# Patient Record
Sex: Female | Born: 1960 | Race: White | Hispanic: No | Marital: Married | Smoking: Never smoker
Health system: Southern US, Community
[De-identification: ages and names within clinical notes are randomized; demographics above are authoritative.]

## PROBLEM LIST (undated history)

## (undated) DIAGNOSIS — F419 Anxiety disorder, unspecified: Secondary | ICD-10-CM

## (undated) DIAGNOSIS — M76822 Posterior tibial tendinitis, left leg: Secondary | ICD-10-CM

## (undated) HISTORY — PX: ENDOMETRIAL ABLATION: SHX621

## (undated) HISTORY — PX: CHOLECYSTECTOMY: SHX55

## (undated) HISTORY — PX: KNEE ARTHROSCOPY: SUR90

---

## 1998-08-10 ENCOUNTER — Other Ambulatory Visit: Admission: RE | Admit: 1998-08-10 | Discharge: 1998-08-10 | Payer: Self-pay | Admitting: *Deleted

## 1998-10-02 ENCOUNTER — Other Ambulatory Visit: Admission: RE | Admit: 1998-10-02 | Discharge: 1998-10-02 | Payer: Self-pay | Admitting: *Deleted

## 1998-12-25 ENCOUNTER — Other Ambulatory Visit: Admission: RE | Admit: 1998-12-25 | Discharge: 1998-12-25 | Payer: Self-pay | Admitting: *Deleted

## 2000-01-20 ENCOUNTER — Other Ambulatory Visit: Admission: RE | Admit: 2000-01-20 | Discharge: 2000-01-20 | Payer: Self-pay | Admitting: *Deleted

## 2002-02-06 ENCOUNTER — Other Ambulatory Visit: Admission: RE | Admit: 2002-02-06 | Discharge: 2002-02-06 | Payer: Self-pay | Admitting: *Deleted

## 2003-02-12 ENCOUNTER — Other Ambulatory Visit: Admission: RE | Admit: 2003-02-12 | Discharge: 2003-02-12 | Payer: Self-pay | Admitting: *Deleted

## 2004-06-08 ENCOUNTER — Other Ambulatory Visit: Admission: RE | Admit: 2004-06-08 | Discharge: 2004-06-08 | Payer: Self-pay | Admitting: Obstetrics and Gynecology

## 2015-09-01 ENCOUNTER — Other Ambulatory Visit: Payer: Self-pay | Admitting: Obstetrics and Gynecology

## 2015-09-01 DIAGNOSIS — R928 Other abnormal and inconclusive findings on diagnostic imaging of breast: Secondary | ICD-10-CM

## 2015-09-11 ENCOUNTER — Ambulatory Visit
Admission: RE | Admit: 2015-09-11 | Discharge: 2015-09-11 | Disposition: A | Discharge status: Home / Self Care | Payer: BLUE CROSS/BLUE SHIELD | Source: Ambulatory Visit | Attending: Obstetrics and Gynecology | Admitting: Obstetrics and Gynecology

## 2015-09-11 DIAGNOSIS — R928 Other abnormal and inconclusive findings on diagnostic imaging of breast: Secondary | ICD-10-CM

## 2015-11-10 ENCOUNTER — Other Ambulatory Visit: Payer: Self-pay | Admitting: Obstetrics and Gynecology

## 2015-11-10 DIAGNOSIS — N63 Unspecified lump in unspecified breast: Secondary | ICD-10-CM

## 2015-11-12 ENCOUNTER — Other Ambulatory Visit: Payer: Self-pay | Admitting: Obstetrics and Gynecology

## 2015-11-12 DIAGNOSIS — N63 Unspecified lump in unspecified breast: Secondary | ICD-10-CM

## 2015-11-18 ENCOUNTER — Ambulatory Visit
Admission: RE | Admit: 2015-11-18 | Discharge: 2015-11-18 | Disposition: A | Discharge status: Home / Self Care | Payer: BLUE CROSS/BLUE SHIELD | Source: Ambulatory Visit | Attending: Obstetrics and Gynecology | Admitting: Obstetrics and Gynecology

## 2015-11-18 ENCOUNTER — Other Ambulatory Visit: Payer: Self-pay | Admitting: Obstetrics and Gynecology

## 2015-11-18 DIAGNOSIS — N63 Unspecified lump in unspecified breast: Secondary | ICD-10-CM

## 2015-11-18 DIAGNOSIS — N6002 Solitary cyst of left breast: Secondary | ICD-10-CM

## 2016-06-21 ENCOUNTER — Other Ambulatory Visit: Payer: Self-pay | Admitting: Obstetrics and Gynecology

## 2016-06-21 DIAGNOSIS — Z1231 Encounter for screening mammogram for malignant neoplasm of breast: Secondary | ICD-10-CM

## 2016-09-02 ENCOUNTER — Ambulatory Visit: Payer: BLUE CROSS/BLUE SHIELD

## 2016-09-02 ENCOUNTER — Ambulatory Visit
Admission: RE | Admit: 2016-09-02 | Discharge: 2016-09-02 | Disposition: A | Discharge status: Home / Self Care | Payer: BLUE CROSS/BLUE SHIELD | Source: Ambulatory Visit | Attending: Obstetrics and Gynecology | Admitting: Obstetrics and Gynecology

## 2016-09-02 DIAGNOSIS — Z1231 Encounter for screening mammogram for malignant neoplasm of breast: Secondary | ICD-10-CM

## 2017-08-02 ENCOUNTER — Other Ambulatory Visit: Payer: Self-pay | Admitting: Obstetrics and Gynecology

## 2017-08-02 DIAGNOSIS — Z1231 Encounter for screening mammogram for malignant neoplasm of breast: Secondary | ICD-10-CM

## 2017-10-09 ENCOUNTER — Ambulatory Visit
Admission: RE | Admit: 2017-10-09 | Discharge: 2017-10-09 | Disposition: A | Discharge status: Home / Self Care | Payer: BC Managed Care – PPO | Source: Ambulatory Visit | Attending: Obstetrics and Gynecology | Admitting: Obstetrics and Gynecology

## 2017-10-09 DIAGNOSIS — Z1231 Encounter for screening mammogram for malignant neoplasm of breast: Secondary | ICD-10-CM

## 2018-08-31 ENCOUNTER — Other Ambulatory Visit: Payer: Self-pay | Admitting: Obstetrics and Gynecology

## 2018-08-31 DIAGNOSIS — Z1231 Encounter for screening mammogram for malignant neoplasm of breast: Secondary | ICD-10-CM

## 2018-10-23 ENCOUNTER — Ambulatory Visit: Payer: BC Managed Care – PPO

## 2018-12-03 ENCOUNTER — Ambulatory Visit
Admission: RE | Admit: 2018-12-03 | Discharge: 2018-12-03 | Disposition: A | Discharge status: Home / Self Care | Payer: BC Managed Care – PPO | Source: Ambulatory Visit | Attending: Obstetrics and Gynecology | Admitting: Obstetrics and Gynecology

## 2018-12-03 ENCOUNTER — Other Ambulatory Visit: Payer: Self-pay

## 2018-12-03 DIAGNOSIS — Z1231 Encounter for screening mammogram for malignant neoplasm of breast: Secondary | ICD-10-CM

## 2018-12-04 ENCOUNTER — Other Ambulatory Visit: Payer: Self-pay | Admitting: Obstetrics and Gynecology

## 2018-12-04 DIAGNOSIS — R928 Other abnormal and inconclusive findings on diagnostic imaging of breast: Secondary | ICD-10-CM

## 2018-12-06 ENCOUNTER — Ambulatory Visit
Admission: RE | Admit: 2018-12-06 | Discharge: 2018-12-06 | Disposition: A | Discharge status: Home / Self Care | Payer: BC Managed Care – PPO | Source: Ambulatory Visit | Attending: Obstetrics and Gynecology | Admitting: Obstetrics and Gynecology

## 2018-12-06 ENCOUNTER — Other Ambulatory Visit: Payer: Self-pay

## 2018-12-06 ENCOUNTER — Other Ambulatory Visit: Payer: Self-pay | Admitting: Obstetrics and Gynecology

## 2018-12-06 DIAGNOSIS — R928 Other abnormal and inconclusive findings on diagnostic imaging of breast: Secondary | ICD-10-CM

## 2018-12-06 DIAGNOSIS — N6002 Solitary cyst of left breast: Secondary | ICD-10-CM

## 2018-12-10 ENCOUNTER — Ambulatory Visit
Admission: RE | Admit: 2018-12-10 | Discharge: 2018-12-10 | Disposition: A | Discharge status: Home / Self Care | Payer: BC Managed Care – PPO | Source: Ambulatory Visit | Attending: Obstetrics and Gynecology | Admitting: Obstetrics and Gynecology

## 2018-12-10 ENCOUNTER — Other Ambulatory Visit: Payer: Self-pay

## 2018-12-10 DIAGNOSIS — N6002 Solitary cyst of left breast: Secondary | ICD-10-CM

## 2019-09-05 ENCOUNTER — Other Ambulatory Visit: Payer: Self-pay | Admitting: Obstetrics and Gynecology

## 2019-09-05 DIAGNOSIS — Z1231 Encounter for screening mammogram for malignant neoplasm of breast: Secondary | ICD-10-CM

## 2019-12-06 ENCOUNTER — Inpatient Hospital Stay: Admission: RE | Admit: 2019-12-06 | Payer: BC Managed Care – PPO | Source: Ambulatory Visit

## 2019-12-06 ENCOUNTER — Other Ambulatory Visit: Payer: Self-pay

## 2019-12-06 ENCOUNTER — Ambulatory Visit
Admission: RE | Admit: 2019-12-06 | Discharge: 2019-12-06 | Disposition: A | Discharge status: Home / Self Care | Payer: BC Managed Care – PPO | Source: Ambulatory Visit | Attending: Obstetrics and Gynecology | Admitting: Obstetrics and Gynecology

## 2019-12-06 DIAGNOSIS — Z1231 Encounter for screening mammogram for malignant neoplasm of breast: Secondary | ICD-10-CM

## 2019-12-09 ENCOUNTER — Ambulatory Visit: Payer: BC Managed Care – PPO

## 2019-12-11 ENCOUNTER — Other Ambulatory Visit: Payer: Self-pay | Admitting: Obstetrics and Gynecology

## 2019-12-11 DIAGNOSIS — R928 Other abnormal and inconclusive findings on diagnostic imaging of breast: Secondary | ICD-10-CM

## 2019-12-16 ENCOUNTER — Other Ambulatory Visit: Payer: Self-pay

## 2019-12-16 ENCOUNTER — Other Ambulatory Visit: Payer: Self-pay | Admitting: Obstetrics and Gynecology

## 2019-12-16 ENCOUNTER — Ambulatory Visit
Admission: RE | Admit: 2019-12-16 | Discharge: 2019-12-16 | Disposition: A | Discharge status: Home / Self Care | Payer: BC Managed Care – PPO | Source: Ambulatory Visit | Attending: Obstetrics and Gynecology | Admitting: Obstetrics and Gynecology

## 2019-12-16 DIAGNOSIS — R928 Other abnormal and inconclusive findings on diagnostic imaging of breast: Secondary | ICD-10-CM

## 2020-04-03 ENCOUNTER — Other Ambulatory Visit (HOSPITAL_COMMUNITY): Payer: Self-pay | Admitting: Orthopedic Surgery

## 2020-04-07 ENCOUNTER — Encounter (HOSPITAL_BASED_OUTPATIENT_CLINIC_OR_DEPARTMENT_OTHER): Payer: Self-pay | Admitting: Orthopedic Surgery

## 2020-04-07 ENCOUNTER — Other Ambulatory Visit: Payer: Self-pay

## 2020-04-10 ENCOUNTER — Other Ambulatory Visit (HOSPITAL_COMMUNITY)
Admission: RE | Admit: 2020-04-10 | Discharge: 2020-04-10 | Disposition: A | Discharge status: Home / Self Care | Payer: BC Managed Care – PPO | Source: Ambulatory Visit | Attending: Orthopedic Surgery | Admitting: Orthopedic Surgery

## 2020-04-10 DIAGNOSIS — Z01812 Encounter for preprocedural laboratory examination: Secondary | ICD-10-CM | POA: Insufficient documentation

## 2020-04-10 DIAGNOSIS — Z20822 Contact with and (suspected) exposure to covid-19: Secondary | ICD-10-CM | POA: Diagnosis not present

## 2020-04-10 LAB — SARS CORONAVIRUS 2 (TAT 6-24 HRS): SARS Coronavirus 2: NEGATIVE

## 2020-04-14 ENCOUNTER — Ambulatory Visit (HOSPITAL_BASED_OUTPATIENT_CLINIC_OR_DEPARTMENT_OTHER): Payer: BC Managed Care – PPO | Admitting: Anesthesiology

## 2020-04-14 ENCOUNTER — Encounter (HOSPITAL_BASED_OUTPATIENT_CLINIC_OR_DEPARTMENT_OTHER)
Admission: RE | Disposition: A | Discharge status: Home / Self Care | Payer: Self-pay | Source: Ambulatory Visit | Attending: Orthopedic Surgery

## 2020-04-14 ENCOUNTER — Other Ambulatory Visit: Payer: Self-pay

## 2020-04-14 ENCOUNTER — Encounter (HOSPITAL_BASED_OUTPATIENT_CLINIC_OR_DEPARTMENT_OTHER): Payer: Self-pay | Admitting: Orthopedic Surgery

## 2020-04-14 ENCOUNTER — Ambulatory Visit (HOSPITAL_BASED_OUTPATIENT_CLINIC_OR_DEPARTMENT_OTHER)
Admission: RE | Admit: 2020-04-14 | Discharge: 2020-04-14 | Disposition: A | Discharge status: Home / Self Care | Payer: BC Managed Care – PPO | Source: Ambulatory Visit | Attending: Orthopedic Surgery | Admitting: Orthopedic Surgery

## 2020-04-14 DIAGNOSIS — Z79899 Other long term (current) drug therapy: Secondary | ICD-10-CM | POA: Insufficient documentation

## 2020-04-14 DIAGNOSIS — M25572 Pain in left ankle and joints of left foot: Secondary | ICD-10-CM | POA: Diagnosis not present

## 2020-04-14 DIAGNOSIS — Q6689 Other  specified congenital deformities of feet: Secondary | ICD-10-CM | POA: Diagnosis not present

## 2020-04-14 DIAGNOSIS — Z885 Allergy status to narcotic agent status: Secondary | ICD-10-CM | POA: Insufficient documentation

## 2020-04-14 DIAGNOSIS — M6702 Short Achilles tendon (acquired), left ankle: Secondary | ICD-10-CM | POA: Insufficient documentation

## 2020-04-14 DIAGNOSIS — M76822 Posterior tibial tendinitis, left leg: Secondary | ICD-10-CM | POA: Diagnosis present

## 2020-04-14 DIAGNOSIS — Z880 Allergy status to penicillin: Secondary | ICD-10-CM | POA: Insufficient documentation

## 2020-04-14 HISTORY — PX: POSTERIOR TIBIAL TENDON REPAIR: SHX6039

## 2020-04-14 HISTORY — DX: Posterior tibial tendinitis, left leg: M76.822

## 2020-04-14 HISTORY — DX: Anxiety disorder, unspecified: F41.9

## 2020-04-14 HISTORY — PX: CALCANEAL OSTEOTOMY: SHX1281

## 2020-04-14 SURGERY — OSTEOTOMY, CALCANEUS
Anesthesia: Regional | Site: Leg Lower | Laterality: Left

## 2020-04-14 MED ORDER — MIDAZOLAM HCL 2 MG/2ML IJ SOLN
1.0000 mg | Freq: Once | INTRAMUSCULAR | Status: AC
  Administered 2020-04-14: 12:00:00 2 mg via INTRAVENOUS

## 2020-04-14 MED ORDER — DEXAMETHASONE SODIUM PHOSPHATE 10 MG/ML IJ SOLN
INTRAMUSCULAR | Status: DC | PRN
  Administered 2020-04-14: 5 mg

## 2020-04-14 MED ORDER — VANCOMYCIN HCL 500 MG IV SOLR
INTRAVENOUS | Status: AC
  Filled 2020-04-14: qty 500

## 2020-04-14 MED ORDER — SODIUM CHLORIDE 0.9 % IV SOLN: INTRAVENOUS | Status: DC

## 2020-04-14 MED ORDER — MIDAZOLAM HCL 2 MG/2ML IJ SOLN
INTRAMUSCULAR | Status: AC
  Filled 2020-04-14: qty 2

## 2020-04-14 MED ORDER — CEFAZOLIN SODIUM-DEXTROSE 2-4 GM/100ML-% IV SOLN
INTRAVENOUS | Status: AC
  Filled 2020-04-14: qty 100

## 2020-04-14 MED ORDER — BUPIVACAINE LIPOSOME 1.3 % IJ SUSP
INTRAMUSCULAR | Status: DC | PRN
  Administered 2020-04-14: 10 mL via PERINEURAL

## 2020-04-14 MED ORDER — BUPIVACAINE HCL (PF) 0.5 % IJ SOLN
INTRAMUSCULAR | Status: AC
  Filled 2020-04-14: qty 30

## 2020-04-14 MED ORDER — 0.9 % SODIUM CHLORIDE (POUR BTL) OPTIME
TOPICAL | Status: DC | PRN
  Administered 2020-04-14: 15:00:00 200 mL

## 2020-04-14 MED ORDER — DEXAMETHASONE SODIUM PHOSPHATE 10 MG/ML IJ SOLN
INTRAMUSCULAR | Status: DC | PRN
  Administered 2020-04-14: 14:00:00 5 mg via INTRAVENOUS

## 2020-04-14 MED ORDER — ONDANSETRON HCL 4 MG PO TABS: 4.0000 mg | ORAL_TABLET | Freq: Every day | ORAL | 0 refills | Status: DC | PRN

## 2020-04-14 MED ORDER — ACETAMINOPHEN 500 MG PO TABS
1000.0000 mg | ORAL_TABLET | Freq: Once | ORAL | Status: AC
  Administered 2020-04-14: 11:00:00 1000 mg via ORAL

## 2020-04-14 MED ORDER — BUPIVACAINE HCL (PF) 0.5 % IJ SOLN
INTRAMUSCULAR | Status: DC | PRN
  Administered 2020-04-14: 20 mL via PERINEURAL

## 2020-04-14 MED ORDER — OXYCODONE HCL 5 MG PO TABS: 5.0000 mg | ORAL_TABLET | ORAL | 0 refills | Status: AC | PRN

## 2020-04-14 MED ORDER — ONDANSETRON HCL 4 MG/2ML IJ SOLN
INTRAMUSCULAR | Status: DC | PRN
  Administered 2020-04-14: 4 mg via INTRAVENOUS

## 2020-04-14 MED ORDER — LACTATED RINGERS IV SOLN: INTRAVENOUS | Status: DC

## 2020-04-14 MED ORDER — FENTANYL CITRATE (PF) 100 MCG/2ML IJ SOLN
INTRAMUSCULAR | Status: AC
  Filled 2020-04-14: qty 2

## 2020-04-14 MED ORDER — PROPOFOL 10 MG/ML IV BOLUS
INTRAVENOUS | Status: AC
  Filled 2020-04-14: qty 20

## 2020-04-14 MED ORDER — LIDOCAINE 2% (20 MG/ML) 5 ML SYRINGE
INTRAMUSCULAR | Status: AC
  Filled 2020-04-14: qty 5

## 2020-04-14 MED ORDER — CEFAZOLIN SODIUM-DEXTROSE 2-4 GM/100ML-% IV SOLN
2.0000 g | INTRAVENOUS | Status: AC
  Administered 2020-04-14: 14:00:00 2 g via INTRAVENOUS

## 2020-04-14 MED ORDER — LIDOCAINE HCL (CARDIAC) PF 100 MG/5ML IV SOSY
PREFILLED_SYRINGE | INTRAVENOUS | Status: DC | PRN
  Administered 2020-04-14: 20 mg via INTRATRACHEAL

## 2020-04-14 MED ORDER — ASPIRIN EC 81 MG PO TBEC: 81.0000 mg | DELAYED_RELEASE_TABLET | Freq: Two times a day (BID) | ORAL | 0 refills | Status: AC

## 2020-04-14 MED ORDER — MIDAZOLAM HCL 2 MG/2ML IJ SOLN
INTRAMUSCULAR | Status: DC | PRN
  Administered 2020-04-14: 2 mg via INTRAVENOUS

## 2020-04-14 MED ORDER — FENTANYL CITRATE (PF) 100 MCG/2ML IJ SOLN
50.0000 ug | Freq: Once | INTRAMUSCULAR | Status: AC
  Administered 2020-04-14: 12:00:00 50 ug via INTRAVENOUS

## 2020-04-14 MED ORDER — ONDANSETRON HCL 4 MG/2ML IJ SOLN
INTRAMUSCULAR | Status: AC
  Filled 2020-04-14: qty 2

## 2020-04-14 MED ORDER — ACETAMINOPHEN 500 MG PO TABS
ORAL_TABLET | ORAL | Status: AC
  Filled 2020-04-14: qty 2

## 2020-04-14 MED ORDER — ROPIVACAINE HCL 5 MG/ML IJ SOLN
INTRAMUSCULAR | Status: DC | PRN
  Administered 2020-04-14: 20 mL via PERINEURAL

## 2020-04-14 MED ORDER — VANCOMYCIN HCL 500 MG IV SOLR
INTRAVENOUS | Status: DC | PRN
  Administered 2020-04-14: 500 mg via TOPICAL

## 2020-04-14 MED ORDER — FENTANYL CITRATE (PF) 100 MCG/2ML IJ SOLN
INTRAMUSCULAR | Status: DC | PRN
  Administered 2020-04-14: 50 ug via INTRAVENOUS

## 2020-04-14 MED ORDER — FENTANYL CITRATE (PF) 100 MCG/2ML IJ SOLN: 25.0000 ug | INTRAMUSCULAR | Status: DC | PRN

## 2020-04-14 MED ORDER — PROPOFOL 10 MG/ML IV BOLUS
INTRAVENOUS | Status: DC | PRN
  Administered 2020-04-14: 200 mg via INTRAVENOUS

## 2020-04-14 SURGICAL SUPPLY — 97 items
ANCH SUT 1 SHRT SM RGD INSRTR (Anchor) ×2 IMPLANT
ANCHOR SUT 1.45 SZ 1 SHORT (Anchor) ×4 IMPLANT
APL PRP STRL LF DISP 70% ISPRP (MISCELLANEOUS) ×2
BANDAGE ESMARK 6X9 LF (GAUZE/BANDAGES/DRESSINGS) IMPLANT
BIT DRILL 2.4 AO COUPLING CANN (BIT) ×4 IMPLANT
BLADE AVERAGE 25MMX9MM (BLADE)
BLADE AVERAGE 25X9 (BLADE) IMPLANT
BLADE MICRO SAGITTAL (BLADE) ×4 IMPLANT
BLADE SURG 15 STRL LF DISP TIS (BLADE) ×4 IMPLANT
BLADE SURG 15 STRL SS (BLADE) ×8
BNDG CMPR 9X4 STRL LF SNTH (GAUZE/BANDAGES/DRESSINGS)
BNDG CMPR 9X6 STRL LF SNTH (GAUZE/BANDAGES/DRESSINGS)
BNDG COHESIVE 4X5 TAN STRL (GAUZE/BANDAGES/DRESSINGS) ×4 IMPLANT
BNDG COHESIVE 6X5 TAN STRL LF (GAUZE/BANDAGES/DRESSINGS) ×4 IMPLANT
BNDG ESMARK 4X9 LF (GAUZE/BANDAGES/DRESSINGS) IMPLANT
BNDG ESMARK 6X9 LF (GAUZE/BANDAGES/DRESSINGS)
CANISTER SUCT 1200ML W/VALVE (MISCELLANEOUS) ×4 IMPLANT
CHLORAPREP W/TINT 26 (MISCELLANEOUS) ×4 IMPLANT
COVER BACK TABLE 60X90IN (DRAPES) ×4 IMPLANT
COVER WAND RF STERILE (DRAPES) IMPLANT
CUFF TOURN SGL QUICK 34 (TOURNIQUET CUFF) ×4
CUFF TRNQT CYL 34X4.125X (TOURNIQUET CUFF) ×2 IMPLANT
DECANTER SPIKE VIAL GLASS SM (MISCELLANEOUS) IMPLANT
DRAPE EXTREMITY T 121X128X90 (DISPOSABLE) ×4 IMPLANT
DRAPE OEC MINIVIEW 54X84 (DRAPES) ×4 IMPLANT
DRAPE U-SHAPE 47X51 STRL (DRAPES) ×4 IMPLANT
DRSG MEPITEL 4X7.2 (GAUZE/BANDAGES/DRESSINGS) ×4 IMPLANT
DRSG PAD ABDOMINAL 8X10 ST (GAUZE/BANDAGES/DRESSINGS) ×8 IMPLANT
ELECT REM PT RETURN 9FT ADLT (ELECTROSURGICAL) ×4
ELECTRODE REM PT RTRN 9FT ADLT (ELECTROSURGICAL) ×2 IMPLANT
GAUZE SPONGE 4X4 12PLY STRL (GAUZE/BANDAGES/DRESSINGS) ×4 IMPLANT
GLOVE BIO SURGEON STRL SZ8 (GLOVE) ×4 IMPLANT
GLOVE ECLIPSE 8.0 STRL XLNG CF (GLOVE) ×4 IMPLANT
GLOVE SRG 8 PF TXTR STRL LF DI (GLOVE) ×4 IMPLANT
GLOVE SURG SS PI 7.0 STRL IVOR (GLOVE) ×8 IMPLANT
GLOVE SURG UNDER POLY LF SZ7 (GLOVE) ×12 IMPLANT
GLOVE SURG UNDER POLY LF SZ8 (GLOVE) ×8
GOWN STRL REUS W/ TWL LRG LVL3 (GOWN DISPOSABLE) ×4 IMPLANT
GOWN STRL REUS W/ TWL XL LVL3 (GOWN DISPOSABLE) ×4 IMPLANT
GOWN STRL REUS W/TWL LRG LVL3 (GOWN DISPOSABLE) ×8
GOWN STRL REUS W/TWL XL LVL3 (GOWN DISPOSABLE) ×8
K-WIRE .062X4 (WIRE) IMPLANT
K-WIRE TROC 1.25X150 (WIRE) ×8
KIT ACCESSORY DRILL 5 (KITS) ×4 IMPLANT
KIT BIO-TENODESIS 3X8 DISP (MISCELLANEOUS)
KIT INSRT BABSR STRL DISP BTN (MISCELLANEOUS) IMPLANT
KWIRE TROC 1.25X150 (WIRE) ×4 IMPLANT
NDL SUT 6 .5 CRC .975X.05 MAYO (NEEDLE) ×2 IMPLANT
NEEDLE HYPO 22GX1.5 SAFETY (NEEDLE) IMPLANT
NEEDLE MAYO TAPER (NEEDLE) ×4
NS IRRIG 1000ML POUR BTL (IV SOLUTION) ×4 IMPLANT
PACK BASIN DAY SURGERY FS (CUSTOM PROCEDURE TRAY) ×4 IMPLANT
PAD CAST 4YDX4 CTTN HI CHSV (CAST SUPPLIES) ×2 IMPLANT
PADDING CAST ABS 4INX4YD NS (CAST SUPPLIES)
PADDING CAST ABS COTTON 4X4 ST (CAST SUPPLIES) IMPLANT
PADDING CAST COTTON 4X4 STRL (CAST SUPPLIES) ×4
PADDING CAST COTTON 6X4 STRL (CAST SUPPLIES) ×4 IMPLANT
PASSER SUT SWANSON 36MM LOOP (INSTRUMENTS) IMPLANT
PENCIL SMOKE EVACUATOR (MISCELLANEOUS) ×4 IMPLANT
RETRIEVER SUT HEWSON (MISCELLANEOUS) IMPLANT
SANITIZER HAND PURELL 535ML FO (MISCELLANEOUS) ×4 IMPLANT
SCOTCHCAST PLUS 4X4 WHITE (CAST SUPPLIES) IMPLANT
SCREW CANNULATED PT 4.0X44 (Screw) ×8 IMPLANT
SCREW PEEK TENODESIS 6X12MM (Screw) ×4 IMPLANT
SHEET MEDIUM DRAPE 40X70 STRL (DRAPES) ×4 IMPLANT
SLEEVE SCD COMPRESS KNEE MED (MISCELLANEOUS) ×4 IMPLANT
SPLINT FAST PLASTER 5X30 (CAST SUPPLIES) ×40
SPLINT PLASTER CAST FAST 5X30 (CAST SUPPLIES) ×40 IMPLANT
SPONGE LAP 18X18 RF (DISPOSABLE) ×4 IMPLANT
STOCKINETTE 6  STRL (DRAPES) ×4
STOCKINETTE 6 STRL (DRAPES) ×2 IMPLANT
SUCTION FRAZIER HANDLE 10FR (MISCELLANEOUS) ×4
SUCTION TUBE FRAZIER 10FR DISP (MISCELLANEOUS) ×2 IMPLANT
SUT BONE WAX W31G (SUTURE) IMPLANT
SUT ETHIBOND 0 MO6 C/R (SUTURE) IMPLANT
SUT ETHIBOND 2 OS 4 DA (SUTURE) IMPLANT
SUT ETHILON 3 0 PS 1 (SUTURE) ×8 IMPLANT
SUT FIBERWIRE #2 38 T-5 BLUE (SUTURE)
SUT FIBERWIRE 2-0 18 17.9 3/8 (SUTURE)
SUT MERSILENE 2.0 SH NDLE (SUTURE) IMPLANT
SUT MNCRL AB 3-0 PS2 18 (SUTURE) ×4 IMPLANT
SUT MNCRL AB 4-0 PS2 18 (SUTURE) IMPLANT
SUT VIC AB 0 SH 27 (SUTURE) ×4 IMPLANT
SUT VIC AB 2-0 SH 18 (SUTURE) IMPLANT
SUT VIC AB 2-0 SH 27 (SUTURE) ×4
SUT VIC AB 2-0 SH 27XBRD (SUTURE) ×2 IMPLANT
SUT VICRYL 0 UR6 27IN ABS (SUTURE) IMPLANT
SUTURE FIBERWR #2 38 T-5 BLUE (SUTURE) IMPLANT
SUTURE FIBERWR 2-0 18 17.9 3/8 (SUTURE) IMPLANT
SUTURE TAPE 1.3 FIBERLOP 20 ST (SUTURE) IMPLANT
SUTURETAPE 1.3 FIBERLOOP 20 ST (SUTURE)
SYR BULB EAR ULCER 3OZ GRN STR (SYRINGE) ×4 IMPLANT
SYR CONTROL 10ML LL (SYRINGE) IMPLANT
TOWEL GREEN STERILE FF (TOWEL DISPOSABLE) ×12 IMPLANT
TUBE CONNECTING 20'X1/4 (TUBING) ×1
TUBE CONNECTING 20X1/4 (TUBING) ×3 IMPLANT
UNDERPAD 30X36 HEAVY ABSORB (UNDERPADS AND DIAPERS) ×4 IMPLANT

## 2020-04-14 NOTE — Anesthesia Postprocedure Evaluation (Signed)
Anesthesia Post Note  Patient: Sabrina West  Procedure(s) Performed: Left gastroc recession; calcaneal osteotomy (Left Leg Lower) Posterior tibial tendon reconstruction; excision of accessory navicular (Left Foot) Flexor Digitorum Longus transfer to the Navicular (Left Foot)     Patient location during evaluation: PACU Anesthesia Type: Regional and General Level of consciousness: awake and alert Pain management: pain level controlled Vital Signs Assessment: post-procedure vital signs reviewed and stable Respiratory status: spontaneous breathing, nonlabored ventilation, respiratory function stable and patient connected to nasal cannula oxygen Cardiovascular status: blood pressure returned to baseline and stable Postop Assessment: no apparent nausea or vomiting Anesthetic complications: no   No complications documented.  Last Vitals:  Vitals:   04/14/20 1539 04/14/20 1600  BP:  128/82  Pulse: 73 84  Resp: 15 16  Temp:  36.8 C  SpO2: 96% 97%    Last Pain:  Vitals:   04/14/20 1600  TempSrc:   PainSc: 0-No pain                 Carlotta Telfair L Annlee Glandon

## 2020-04-14 NOTE — Anesthesia Procedure Notes (Signed)
Procedure Name: LMA Insertion Date/Time: 04/14/2020 2:00 PM Performed by: Salomon Mast, CRNA Pre-anesthesia Checklist: Patient identified, Emergency Drugs available, Suction available and Patient being monitored Patient Re-evaluated:Patient Re-evaluated prior to induction Oxygen Delivery Method: Circle system utilized Preoxygenation: Pre-oxygenation with 100% oxygen Induction Type: IV induction LMA: LMA inserted LMA Size: 4.0 Number of attempts: 1 Placement Confirmation: positive ETCO2 and breath sounds checked- equal and bilateral Tube secured with: Tape Dental Injury: Teeth and Oropharynx as per pre-operative assessment

## 2020-04-14 NOTE — Progress Notes (Signed)
Assisted Dr. Woodrum with left, ultrasound guided, popliteal, adductor canal block. Side rails up, monitors on throughout procedure. See vital signs in flow sheet. Tolerated Procedure well. °

## 2020-04-14 NOTE — H&P (Signed)
Sabrina West is an 59 y.o. female.   Chief Complaint: Left foot pain   HPI: The patient is a 59 year old female without significant past medical history.  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  She has failed nonoperative treatment to date including activity modification, bracing, oral anti-inflammatories and physical therapy.  She presents now for surgical treatment of this painful left ankle and hindfoot deformity.  Past Medical History:  Diagnosis Date  . Anxiety   . Posterior tibial tendonitis, left     Past Surgical History:  Procedure Laterality Date  . CHOLECYSTECTOMY    . ENDOMETRIAL ABLATION    . KNEE ARTHROSCOPY Right     History reviewed. No pertinent family history. Social History:  reports that she has never smoked. She has never used smokeless tobacco. She reports current alcohol use. She reports that she does not use drugs.  Allergies:  Allergies  Allergen Reactions  . Codeine   . Penicillins Rash    Medications Prior to Admission  Medication Sig Dispense Refill  . FLUoxetine (PROZAC) 10 MG tablet Take 10 mg by mouth daily.    . valACYclovir (VALTREX) 1000 MG tablet Take 1,000 mg by mouth 2 (two) times daily.      No results found for this or any previous visit (from the past 48 hour(s)). No results found.  Review of Systems no recent fever, chills, nausea, vomiting or changes in her appetite  Blood pressure 122/87, pulse 71, temperature 99 F (37.2 C), temperature source Oral, resp. rate 12, height 5\' 9"  (1.753 m), weight 79 kg, SpO2 100 %. Physical Exam  Well-nourished well-developed woman in no apparent distress.  Alert and oriented x4.  Normal mood and affect.  Gait is antalgic to the left.  Left foot has some collapse of the longitudinal arch.  Skin is healthy and intact.  Pulses are palpable.  No lymphadenopathy.  Sensibility to light  touch is intact in the sural and saphenous nerve distributions.   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  The risks and benefits of the alternative treatment options have been discussed in detail.  The patient wishes to proceed with surgery and specifically understands risks of bleeding, infection, nerve damage, blood clots, need for additional surgery, amputation and death.   , MD 04/30/20, 1:47 PM

## 2020-04-14 NOTE — Anesthesia Preprocedure Evaluation (Addendum)
Anesthesia Evaluation  Patient identified by MRN, date of birth, ID band Patient awake    Reviewed: Allergy & Precautions, NPO status , Patient's Chart, lab work & pertinent test results  Airway Mallampati: II  TM Distance: >3 FB Neck ROM: Full    Dental no notable dental hx. (+) Teeth Intact, Dental Advisory Given   Pulmonary neg pulmonary ROS,    Pulmonary exam normal breath sounds clear to auscultation       Cardiovascular negative cardio ROS Normal cardiovascular exam Rhythm:Regular Rate:Normal     Neuro/Psych PSYCHIATRIC DISORDERS Anxiety negative neurological ROS     GI/Hepatic negative GI ROS, Neg liver ROS,   Endo/Other  negative endocrine ROS  Renal/GU negative Renal ROS  negative genitourinary   Musculoskeletal negative musculoskeletal ROS (+)   Abdominal   Peds  Hematology negative hematology ROS (+)   Anesthesia Other Findings Left posterior tibial tendon tendinitis, short left Achilles tendon  Reproductive/Obstetrics                            Anesthesia Physical Anesthesia Plan  ASA: II  Anesthesia Plan: General and Regional   Post-op Pain Management:  Regional for Post-op pain   Induction: Intravenous  PONV Risk Score and Plan: 3 and Ondansetron, Dexamethasone and Midazolam  Airway Management Planned: LMA  Additional Equipment:   Intra-op Plan:   Post-operative Plan: Extubation in OR  Informed Consent: I have reviewed the patients History and Physical, chart, labs and discussed the procedure including the risks, benefits and alternatives for the proposed anesthesia with the patient or authorized representative who has indicated his/her understanding and acceptance.     Dental advisory given  Plan Discussed with: CRNA  Anesthesia Plan Comments:         Anesthesia Quick Evaluation

## 2020-04-14 NOTE — Transfer of Care (Signed)
Immediate Anesthesia Transfer of Care Note  Patient: Sabrina West  Procedure(s) Performed: Left gastroc recession; calcaneal osteotomy (Left Leg Lower) Posterior tibial tendon reconstruction; excision of accessory navicular (Left Foot) Flexor Digitorum Longus transfer to the Navicular (Left Foot)  Patient Location: PACU  Anesthesia Type:GA combined with regional for post-op pain  Level of Consciousness: drowsy  Airway & Oxygen Therapy: Patient Spontanous Breathing and Patient connected to face mask oxygen  Post-op Assessment: Report given to RN and Post -op Vital signs reviewed and stable  Post vital signs: Reviewed and stable  Last Vitals:  Vitals Value Taken Time  BP    Temp    Pulse 88 04/14/20 1518  Resp 16 04/14/20 1518  SpO2 100 % 04/14/20 1518  Vitals shown include unvalidated device data.  Last Pain:  Vitals:   04/14/20 1025  TempSrc: Oral  PainSc: 4       Patients Stated Pain Goal: 5 (04/14/20 1025)  Complications: No complications documented.

## 2020-04-14 NOTE — Anesthesia Procedure Notes (Signed)
Anesthesia Regional Block: Adductor canal block   Pre-Anesthetic Checklist: ,, timeout performed, Correct Patient, Correct Site, Correct Laterality, Correct Procedure, Correct Position, site marked, Risks and benefits discussed,  Surgical consent,  Pre-op evaluation,  At surgeon's request and post-op pain management  Laterality: Left  Prep: Maximum Sterile Barrier Precautions used, chloraprep       Needles:  Injection technique: Single-shot  Needle Type: Echogenic Stimulator Needle     Needle Length: 9cm  Needle Gauge: 22     Additional Needles:   Procedures:,,,, ultrasound used (permanent image in chart),,,,  Narrative:  Start time: 04/14/2020 12:18 PM End time: 04/14/2020 12:22 PM Injection made incrementally with aspirations every 5 mL.  Performed by: Personally  Anesthesiologist: Elmer Picker, MD  Additional Notes: Monitors applied. No increased pain on injection. No increased resistance to injection. Injection made in 5cc increments. Good needle visualization. Patient tolerated procedure well.

## 2020-04-14 NOTE — Discharge Instructions (Addendum)
Toni Arthurs, MD [facility]  Please read the following information regarding your care after surgery.  Medications  You only need a prescription for the narcotic pain medicine (ex. oxycodone, Percocet, Norco).  All of the other medicines listed below are available over the counter. X Aleve 2 pills twice a day for the first 3 days after surgery. X acetominophen (Tylenol) 650 mg every 4-6 hours as you need for minor to moderate pain X oxycodone as prescribed for severe pain X zofran as prescribed for post-op nausea  Narcotic pain medicine (ex. oxycodone, Percocet, Vicodin) will cause constipation.  To prevent this problem, take the following medicines while you are taking any pain medicine. X docusate sodium (Colace) 100 mg twice a day X senna (Senokot) 2 tablets twice a day  X To help prevent blood clots, take a baby aspirin (81 mg) twice a day for six weeks after surgery.  You should also get up every hour while you are awake to move around.    Weight Bearing X Do not bear any weight on the operated leg or foot.  Cast / Splint / Dressing X Keep your splint, cast or dressing clean and dry.  Don't put anything (coat hanger, pencil, etc) down inside of it.  If it gets damp, use a hair dryer on the cool setting to dry it.  If it gets soaked, call the office to schedule an appointment for a cast change.   After your dressing, cast or splint is removed; you may shower, but do not soak or scrub the wound.  Allow the water to run over it, and then gently pat it dry.  Swelling It is normal for you to have swelling where you had surgery.  To reduce swelling and pain, keep your toes above your nose for at least 3 days after surgery.  It may be necessary to keep your foot or leg elevated for several weeks.  If it hurts, it should be elevated.  Follow Up Call my office at [phone] when you are discharged from the hospital or surgery center to schedule an appointment to be seen two weeks after  surgery.  Call my office at [phone] if you develop a fever >101.5 F, nausea, vomiting, bleeding from the surgical site or severe pain.     Post Anesthesia Home Care Instructions  Activity: Get plenty of rest for the remainder of the day. A responsible individual must stay with you for 24 hours following the procedure.  For the next 24 hours, DO NOT: -Drive a car -Advertising copywriter -Drink alcoholic beverages -Take any medication unless instructed by your physician -Make any legal decisions or sign important papers.  Meals: Start with liquid foods such as gelatin or soup. Progress to regular foods as tolerated. Avoid greasy, spicy, heavy foods. If nausea and/or vomiting occur, drink only clear liquids until the nausea and/or vomiting subsides. Call your physician if vomiting continues.  Special Instructions/Symptoms: Your throat may feel dry or sore from the anesthesia or the breathing tube placed in your throat during surgery. If this causes discomfort, gargle with warm salt water. The discomfort should disappear within 24 hours.  If you had a scopolamine patch placed behind your ear for the management of post- operative nausea and/or vomiting:  1. The medication in the patch is effective for 72 hours, after which it should be removed.  Wrap patch in a tissue and discard in the trash. Wash hands thoroughly with soap and water. 2. You may remove the patch  earlier than 72 hours if you experience unpleasant side effects which may include dry mouth, dizziness or visual disturbances. 3. Avoid touching the patch. Wash your hands with soap and water after contact with the patch.    May take Tylenol after 5pm, if needed.   Information for Discharge Teaching: EXPAREL (bupivacaine liposome injectable suspension)   Your surgeon or anesthesiologist gave you EXPAREL(bupivacaine) to help control your pain after surgery.   EXPAREL is a local anesthetic that provides pain relief by numbing  the tissue around the surgical site.  EXPAREL is designed to release pain medication over time and can control pain for up to 72 hours.  Depending on how you respond to EXPAREL, you may require less pain medication during your recovery.  Possible side effects:  Temporary loss of sensation or ability to move in the area where bupivacaine was injected.  Nausea, vomiting, constipation  Rarely, numbness and tingling in your mouth or lips, lightheadedness, or anxiety may occur.  Call your doctor right away if you think you may be experiencing any of these sensations, or if you have other questions regarding possible side effects.  Follow all other discharge instructions given to you by your surgeon or nurse. Eat a healthy diet and drink plenty of water or other fluids.  If you return to the hospital for any reason within 96 hours following the administration of EXPAREL, it is important for health care providers to know that you have received this anesthetic. A teal colored band has been placed on your arm with the date, time and amount of EXPAREL you have received in order to alert and inform your health care providers. Please leave this armband in place for the full 96 hours following administration, and then you may remove the band.Regional Anesthesia Blocks  1. Numbness or the inability to move the "blocked" extremity may last from 3-48 hours after placement. The length of time depends on the medication injected and your individual response to the medication. If the numbness is not going away after 48 hours, call your surgeon.  2. The extremity that is blocked will need to be protected until the numbness is gone and the  Strength has returned. Because you cannot feel it, you will need to take extra care to avoid injury. Because it may be weak, you may have difficulty moving it or using it. You may not know what position it is in without looking at it while the block is in effect.  3. For blocks  in the legs and feet, returning to weight bearing and walking needs to be done carefully. You will need to wait until the numbness is entirely gone and the strength has returned. You should be able to move your leg and foot normally before you try and bear weight or walk. You will need someone to be with you when you first try to ensure you do not fall and possibly risk injury.  4. Bruising and tenderness at the needle site are common side effects and will resolve in a few days.  5. Persistent numbness or new problems with movement should be communicated to the surgeon or the [facility] [phone] [facility] [phone]).

## 2020-04-14 NOTE — Op Note (Signed)
04/14/2020  3:28 PM  PATIENT:  Sabrina West  59 y.o. female  PRE-OPERATIVE DIAGNOSIS:  1.  Left posterior tibial tendon tendinitis     2.  short left Achilles tendon     3.  Left painful accessory navicular  POST-OPERATIVE DIAGNOSIS:  Same  Procedure(s): 1.  Left gastroc recession 2.  Left calcaneal osteotomy (separate incision) 3.  Excision of left accessory navicular and reconstruction of the posterior tibial tendon 4.  Transfer of the FDL to the navicular 5.  Left foot AP, lateral and Harris heel xrays  SURGEON:  Toni Arthurs, MD  ASSISTANT: Alfredo Martinez, PA-C  ANESTHESIA:   General, regional  EBL:  minimal   TOURNIQUET:   Total Tourniquet Time Documented: Thigh (Left) - 56 minutes Total: Thigh (Left) - 56 minutes  COMPLICATIONS:  None apparent  DISPOSITION:  Extubated, awake and stable to recovery.  INDICATION FOR PROCEDURE: The patient is a 59 year old female without significant past medical history.  She has a long history of left ankle and hindfoot pain due to stage II posterior tibial tendon dysfunction and a tight heel cord.  She also has a painful accessory navicular.  She has failed nonoperative treatment to date including activity modification, oral anti-inflammatories, bracing and therapy.  She presents now for operative treatment of this painful and limiting condition.  PROCEDURE IN DETAIL:  After pre operative consent was obtained, and the correct operative site was identified, the patient was brought to the operating room and placed supine on the OR table.  Anesthesia was administered.  Pre-operative antibiotics were administered.  A surgical timeout was taken.  The left lower extremity was prepped and draped in standard sterile fashion with a tourniquet around the thigh.  The extremity was elevated and the tourniquet was inflated to 250 mmHg.  An incision was then made over the medial calf.  Dissection was carried down through the subcutaneous tissues.  The  gastrocnemius tendon was identified.  It was divided under direct vision from medial to lateral taking care to protect the sural nerve.  The ankle would then dorsiflex appropriately.  The wound was irrigated and sprinkled with vancomycin powder.  It was closed with Monocryl and nylon.  Attention was turned to the calcaneus where an incision was made over the lateral wall.  Sharp dissection was carried down through the skin and periosteum.  The osteotomy was marked with a K wire.  Radiographs confirmed appropriate location of the osteotomy.  The osteotomy was made with the oscillating saw taking care to protect the soft tissues dorsally and plantarly.  The tuberosity was translated medially.  The osteotomy was fixed with two 4 mm partially-threaded cannulated screws from the [employer] cannulated screw set.  The wound was irrigated and sprinkled with vancomycin powder.  It was then closed with nylon.  Attention was turned to the medial hindfoot where an incision was made over the posterior tibial tendon.  Dissection was carried sharply down through the subcutaneous tissues.  The tendon sheath was incised.  There were degenerative changes at the tendon distally.  The accessory navicular was identified.  It was sharply excised and the tendon debrided of all degenerated tissues.  The synchondrosis was debrided exposing healthy bone.  The FDL tendon sheath was then identified.  It was incised.  The FDL tendon was dissected free down to the knot of [place].  It was transected and whipstitched with Vicryl.  The guidepin was inserted through the navicular tuberosity distally.  Radiographs confirmed appropriate  position of the guidepin.  It was overdrilled and removed.  The FDL was then pulled up through this hole and secured with a 6 mm [facility] medical bolt.  A #1 rigid juggernaut anchor was then inserted into the tuberosity of the navicular.  The posterior tibial tendon was advanced onto the healthy surface of bone  at the synchondrosis and repaired with the anchor.  The tendon was further sutured to the adjacent periosteum with figure-of-eight sutures of 2-0 Vicryl.  AP, lateral and Harris heel radiographs showed appropriate correction of the arch and appropriate position and length of all hardware.  The wounds were irrigated copiously.  Vancomycin powder was sprinkled in the wounds.  Subcutaneous tissues were approximated with Vicryl.  The skin incision was closed with nylon.  Sterile dressings were applied followed by a well-padded short leg splint.  The tourniquet was released after application of the dressings.  The patient was awakened from anesthesia and transported to the recovery room in stable condition.   FOLLOW UP PLAN: Nonweightbearing on the operated lower extremity.  Follow-up in the office in 2 weeks for suture removal and conversion to a short leg cast.  Plan 6 weeks nonweightbearing immobilization postoperatively.   RADIOGRAPHS: AP, lateral and Harris heel radiographs of the right foot are obtained intraoperatively.  These show interval correction of the arch deformity and appropriate position and length of all hardware.  No other acute injuries are noted.    Alfredo Martinez PA-C was present and scrubbed for the duration of the operative case. His assistance was essential in positioning the patient, prepping and draping, gaining and maintaining exposure, performing the operation, closing and dressing the wounds and applying the splint.

## 2020-04-14 NOTE — Anesthesia Procedure Notes (Signed)
Anesthesia Regional Block: Popliteal block   Pre-Anesthetic Checklist: ,, timeout performed, Correct Patient, Correct Site, Correct Laterality, Correct Procedure, Correct Position, site marked, Risks and benefits discussed,  Surgical consent,  Pre-op evaluation,  At surgeon's request and post-op pain management  Laterality: Left  Prep: Maximum Sterile Barrier Precautions used, chloraprep       Needles:  Injection technique: Single-shot  Needle Type: Echogenic Stimulator Needle     Needle Length: 9cm  Needle Gauge: 22     Additional Needles:   Procedures:,,,, ultrasound used (permanent image in chart),,,,  Narrative:  Start time: 04/14/2020 12:10 PM End time: 04/14/2020 12:18 PM Injection made incrementally with aspirations every 5 mL.  Performed by: Personally  Anesthesiologist: Elmer Picker, MD  Additional Notes: Monitors applied. No increased pain on injection. No increased resistance to injection. Injection made in 5cc increments. Good needle visualization. Patient tolerated procedure well.

## 2020-04-15 ENCOUNTER — Encounter (HOSPITAL_BASED_OUTPATIENT_CLINIC_OR_DEPARTMENT_OTHER): Payer: Self-pay | Admitting: Orthopedic Surgery

## 2020-06-17 ENCOUNTER — Other Ambulatory Visit: Payer: BC Managed Care – PPO

## 2020-06-26 ENCOUNTER — Ambulatory Visit
Admission: RE | Admit: 2020-06-26 | Discharge: 2020-06-26 | Disposition: A | Discharge status: Home / Self Care | Payer: BC Managed Care – PPO | Source: Ambulatory Visit | Attending: Obstetrics and Gynecology | Admitting: Obstetrics and Gynecology

## 2020-06-26 ENCOUNTER — Other Ambulatory Visit: Payer: Self-pay | Admitting: Obstetrics and Gynecology

## 2020-06-26 ENCOUNTER — Other Ambulatory Visit: Payer: Self-pay

## 2020-06-26 DIAGNOSIS — R928 Other abnormal and inconclusive findings on diagnostic imaging of breast: Secondary | ICD-10-CM

## 2021-01-04 ENCOUNTER — Other Ambulatory Visit: Payer: Self-pay

## 2021-01-04 ENCOUNTER — Ambulatory Visit
Admission: RE | Admit: 2021-01-04 | Discharge: 2021-01-04 | Disposition: A | Discharge status: Home / Self Care | Payer: BC Managed Care – PPO | Source: Ambulatory Visit | Attending: Obstetrics and Gynecology | Admitting: Obstetrics and Gynecology

## 2021-01-04 DIAGNOSIS — R928 Other abnormal and inconclusive findings on diagnostic imaging of breast: Secondary | ICD-10-CM

## 2021-08-12 ENCOUNTER — Other Ambulatory Visit: Payer: Self-pay | Admitting: Cardiovascular Disease

## 2021-08-12 ENCOUNTER — Other Ambulatory Visit (HOSPITAL_COMMUNITY): Payer: Self-pay | Admitting: Cardiovascular Disease

## 2021-08-12 DIAGNOSIS — R079 Chest pain, unspecified: Secondary | ICD-10-CM

## 2021-08-13 ENCOUNTER — Encounter (HOSPITAL_COMMUNITY): Payer: Self-pay

## 2021-08-13 ENCOUNTER — Other Ambulatory Visit (HOSPITAL_COMMUNITY): Payer: Self-pay | Admitting: Emergency Medicine

## 2021-08-13 DIAGNOSIS — R079 Chest pain, unspecified: Secondary | ICD-10-CM

## 2021-08-13 MED ORDER — METOPROLOL TARTRATE 100 MG PO TABS: 100.0000 mg | ORAL_TABLET | Freq: Once | ORAL | 0 refills | Status: DC

## 2021-08-16 ENCOUNTER — Telehealth (HOSPITAL_COMMUNITY): Payer: Self-pay | Admitting: *Deleted

## 2021-08-16 NOTE — Telephone Encounter (Signed)
Reaching out to patient to offer assistance regarding upcoming cardiac imaging study; pt verbalizes understanding of appt date/time, parking situation and where to check in, pre-test NPO status and medications ordered, and verified current allergies; name and call back number provided for further questions should they arise  Ashland Osmer RN Navigator Cardiac Imaging [facility] [phone] office [phone] cell  Patient to take 100mg metoprolol tartrate two hours prior to her cardiac CT scan. She is aware to arrive at 10am. 

## 2021-08-18 ENCOUNTER — Ambulatory Visit (HOSPITAL_COMMUNITY): Admission: RE | Admit: 2021-08-18 | Payer: BC Managed Care – PPO | Source: Ambulatory Visit

## 2021-08-18 ENCOUNTER — Other Ambulatory Visit (HOSPITAL_COMMUNITY): Payer: Self-pay | Admitting: *Deleted

## 2021-08-18 DIAGNOSIS — R079 Chest pain, unspecified: Secondary | ICD-10-CM

## 2021-08-18 MED ORDER — METOPROLOL TARTRATE 100 MG PO TABS: 100.0000 mg | ORAL_TABLET | Freq: Once | ORAL | 0 refills | Status: DC

## 2021-08-27 ENCOUNTER — Telehealth (HOSPITAL_COMMUNITY): Payer: Self-pay | Admitting: *Deleted

## 2021-08-27 NOTE — Telephone Encounter (Signed)
Attempted to call patient regarding upcoming cardiac CT appointment. °Left message on voicemail with name and callback number ° °Anterio Scheel RN Navigator Cardiac Imaging °[facility] °[phone] Office °[phone] Cell ° °

## 2021-08-27 NOTE — Telephone Encounter (Signed)
Patient returning call regarding upcoming cardiac imaging study; pt verbalizes understanding of appt date/time, parking situation and where to check in, pre-test NPO status and medications ordered, and verified current allergies; name and call back number provided for further questions should they arise ? ?Gordy Clement RN Navigator Cardiac Imaging ?[facility] ?[phone] office ?[phone] cell ? ?Patient to take 100mg  metoprolol tartrate two hours prior to her cardiac CT scan.  She is aware to arrive at 8am. ?

## 2021-08-30 ENCOUNTER — Ambulatory Visit (HOSPITAL_COMMUNITY)
Admission: RE | Admit: 2021-08-30 | Discharge: 2021-08-30 | Disposition: A | Discharge status: Home / Self Care | Payer: BC Managed Care – PPO | Source: Ambulatory Visit | Attending: Cardiology | Admitting: Cardiology

## 2021-08-30 DIAGNOSIS — R079 Chest pain, unspecified: Secondary | ICD-10-CM | POA: Insufficient documentation

## 2021-08-30 MED ORDER — IOHEXOL 350 MG/ML SOLN
100.0000 mL | Freq: Once | INTRAVENOUS | Status: AC | PRN
Start: 2021-08-30 — End: 2021-08-30
  Administered 2021-08-30: 100 mL via INTRAVENOUS

## 2021-08-30 MED ORDER — NITROGLYCERIN 0.4 MG SL SUBL
0.8000 mg | SUBLINGUAL_TABLET | Freq: Once | SUBLINGUAL | Status: AC
  Administered 2021-08-30: 0.8 mg via SUBLINGUAL

## 2021-08-30 MED ORDER — NITROGLYCERIN 0.4 MG SL SUBL
SUBLINGUAL_TABLET | SUBLINGUAL | Status: AC
  Filled 2021-08-30: qty 2

## 2021-12-07 ENCOUNTER — Other Ambulatory Visit: Payer: Self-pay | Admitting: Obstetrics and Gynecology

## 2021-12-07 DIAGNOSIS — N6320 Unspecified lump in the left breast, unspecified quadrant: Secondary | ICD-10-CM

## 2022-01-26 IMAGING — MG DIGITAL DIAGNOSTIC BILAT W/ TOMO W/ CAD
8 series · 8 of 24 positions shown · non-contrast
Comparison: Prior films

CLINICAL DATA: Short-term follow-up left breast mass

EXAM:
DIGITAL DIAGNOSTIC BILATERAL MAMMOGRAM WITH TOMOSYNTHESIS AND CAD;
ULTRASOUND LEFT BREAST LIMITED
TECHNIQUE: Bilateral digital diagnostic mammography and breast tomosynthesis
was performed. The images were evaluated with computer-aided
detection.; Targeted ultrasound examination of the left breast was
performed.

[R CC synth-2D]
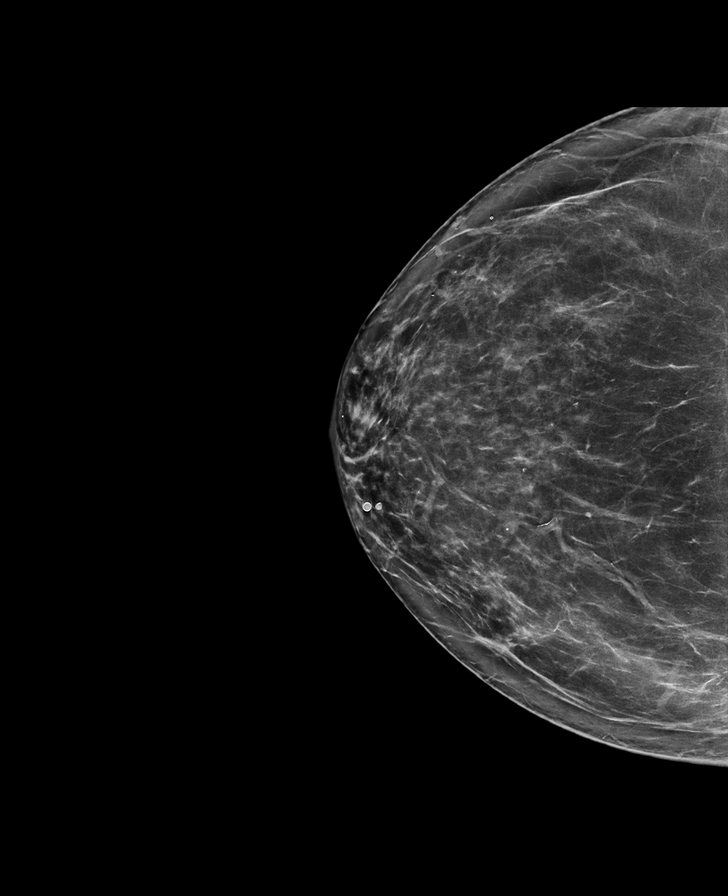

[R MLO synth-2D]
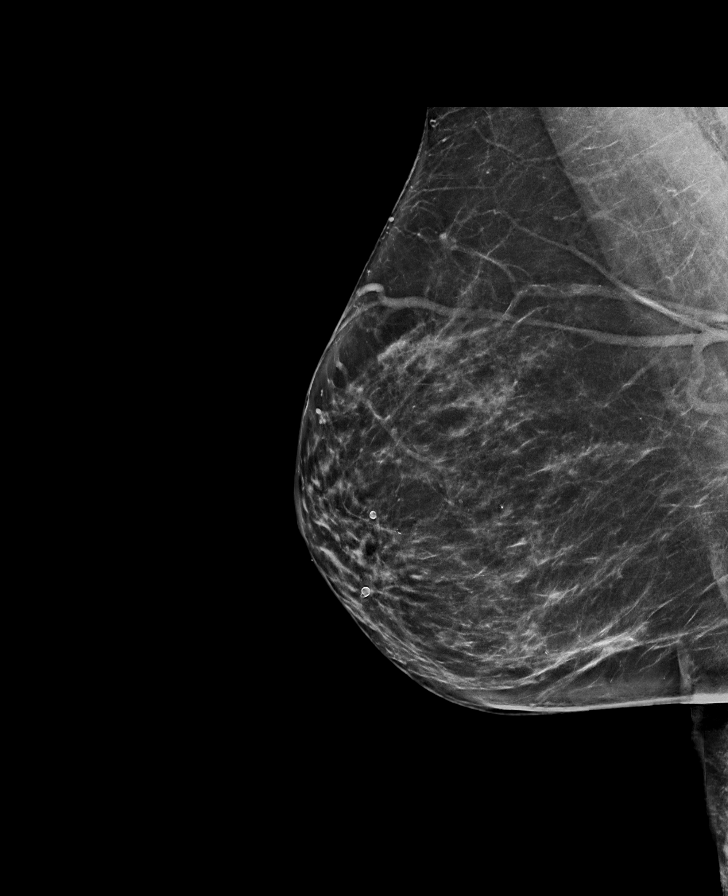

[L CC synth-2D]
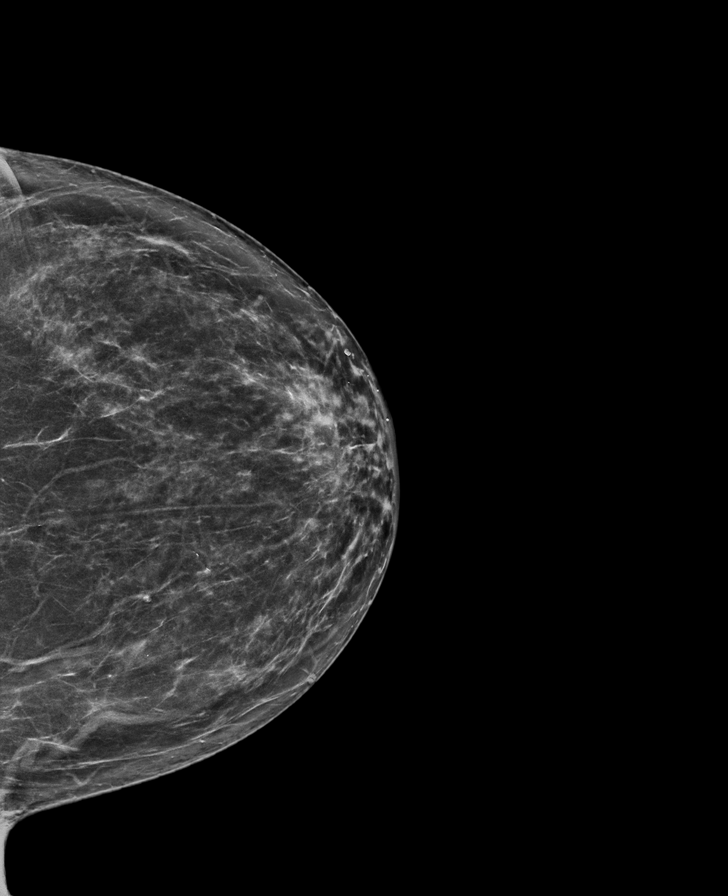

[L MLO synth-2D]
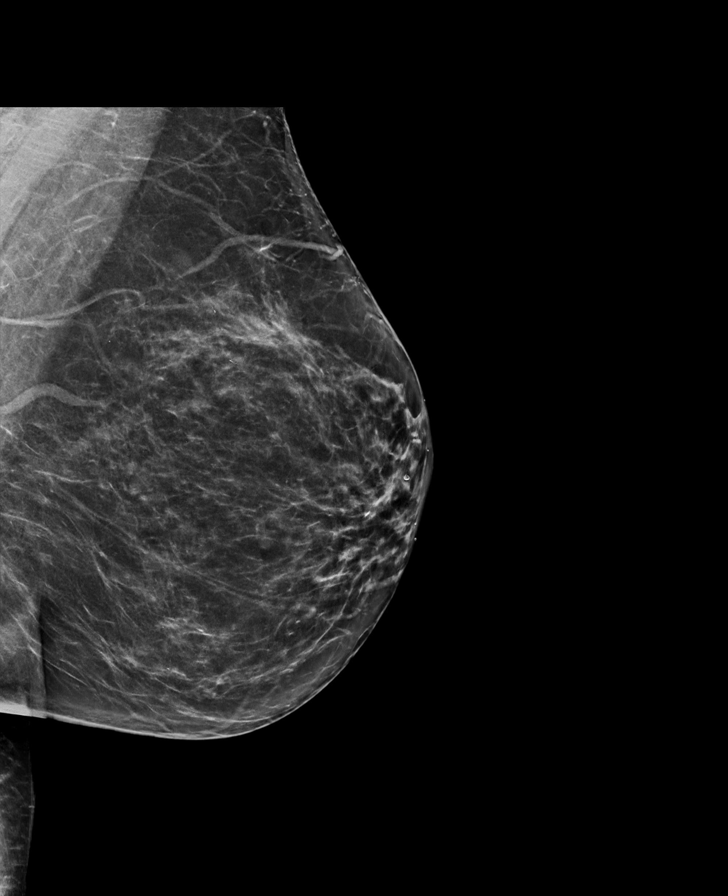

[L CC tomo · tomo slice 41/80.0]
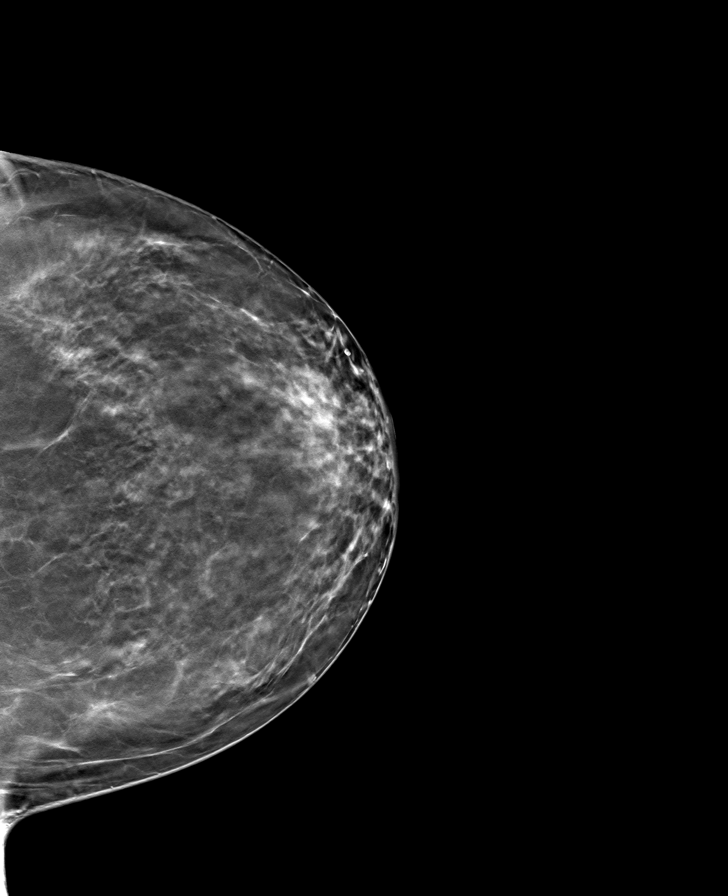

[R CC tomo · tomo slice 42/83.0]
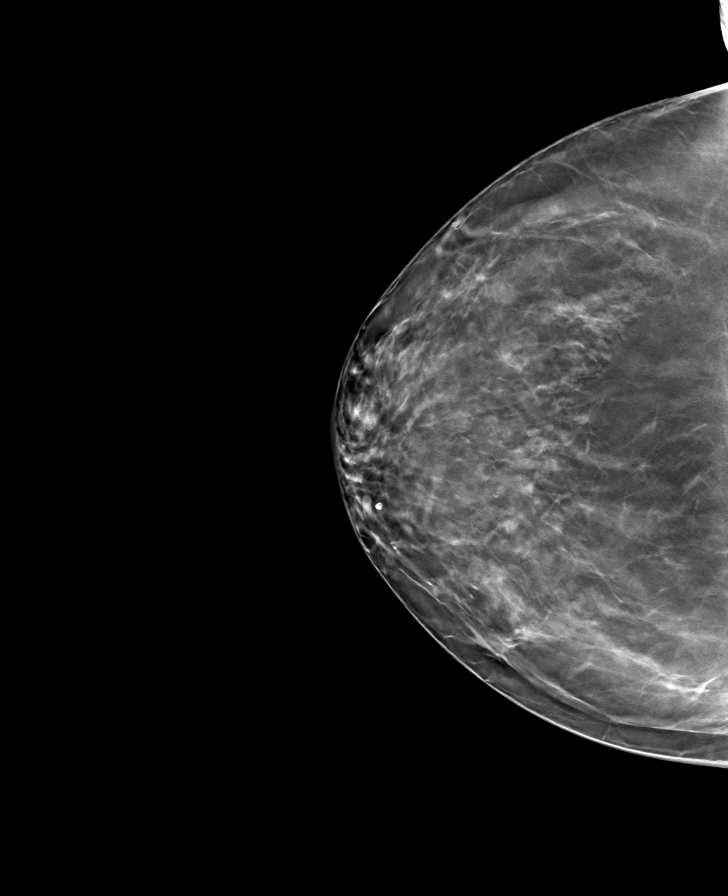

[R MLO tomo · tomo slice 43/84.0]
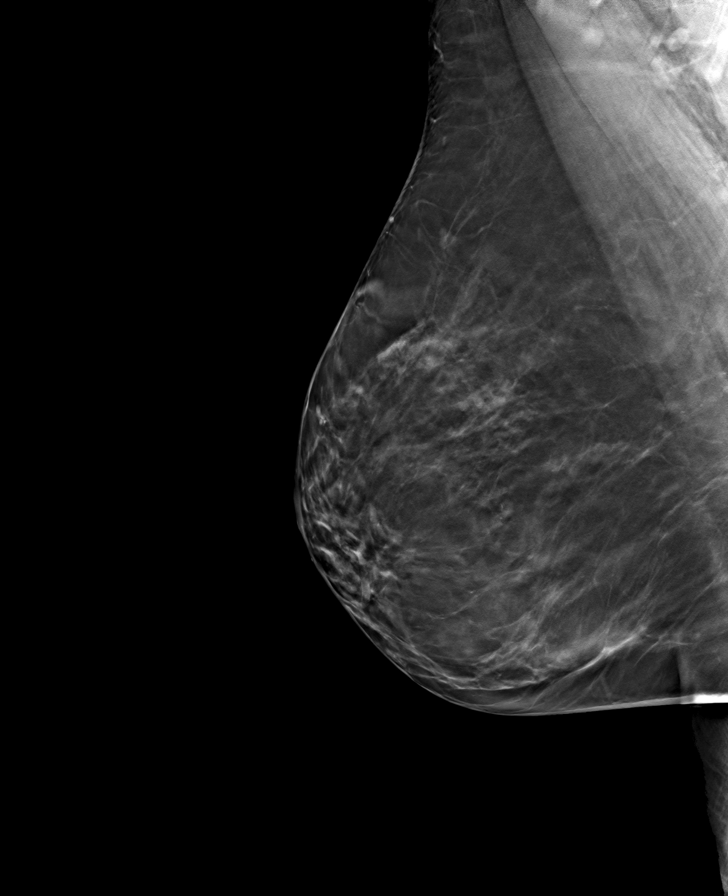

[L MLO tomo · tomo slice 41/81.0]
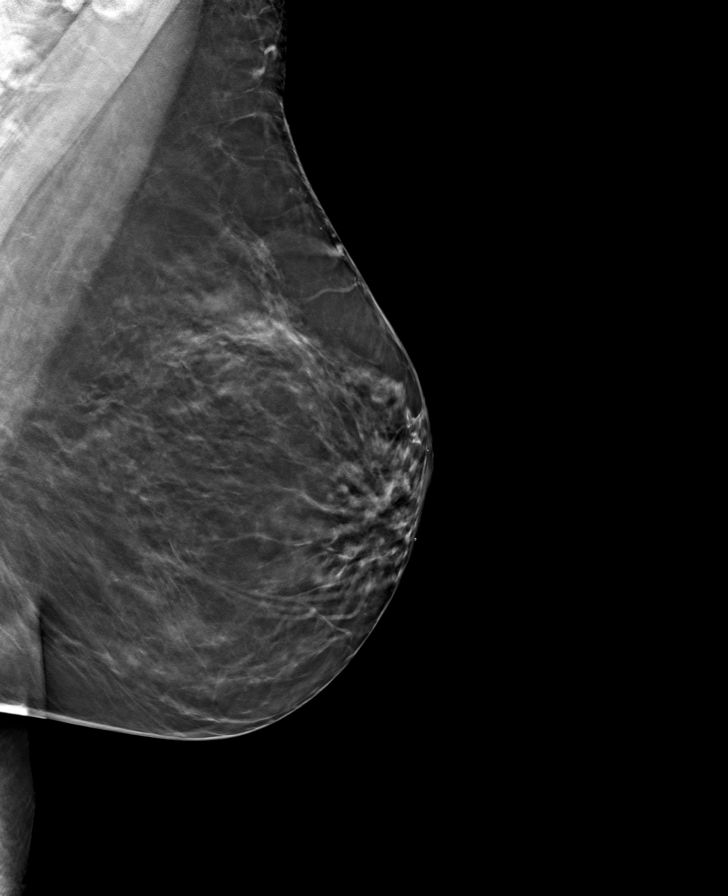

[8 of 24 positions shown; findings below may reference images not displayed]

ACR Breast Density Category c: The breast tissue is heterogeneously
dense, which may obscure small masses.
FINDINGS: Cc and MLO views bilateral breasts are submitted. The previously
noted mass in the lateral left breast is less well seen on current
exam. No suspicious abnormality is identified bilaterally.

Targeted ultrasound is performed, showing probable complicated cyst
the left breast 4 o'clock 3 cm nipple measuring 3 x 3 x 4 mm
minimally smaller compared to prior exam.
IMPRESSION: Probable benign findings.

RECOMMENDATION:
Bilateral diagnostic mammogram and left breast ultrasound in 12
months.

I have discussed the findings and recommendations with the patient.
If applicable, a reminder letter will be sent to the patient
regarding the next appointment.

BI-RADS CATEGORY  3: Probably benign.

## 2022-02-01 ENCOUNTER — Encounter: Payer: Self-pay | Admitting: Cardiovascular Disease

## 2022-02-01 ENCOUNTER — Ambulatory Visit: Payer: BC Managed Care – PPO | Attending: Cardiovascular Disease | Admitting: Cardiovascular Disease

## 2022-02-01 DIAGNOSIS — R0789 Other chest pain: Secondary | ICD-10-CM | POA: Diagnosis not present

## 2022-02-01 MED ORDER — AMLODIPINE BESYLATE 2.5 MG PO TABS: 2.5000 mg | ORAL_TABLET | Freq: Every day | ORAL | 3 refills | Status: AC

## 2022-02-01 MED ORDER — ROSUVASTATIN CALCIUM 5 MG PO TABS: 5.0000 mg | ORAL_TABLET | ORAL | 3 refills | Status: AC

## 2022-02-01 NOTE — Assessment & Plan Note (Signed)
Sabrina West was referred to me by her PCP, Dr. Kennedy Bucker at [place] [facility] because of atypical chest pain.  She had seen Danielle Dess, PA-C and Dr. Inocencio Homes.  She her only risk factor is untreated hyperlipidemia.  She is unaware of her family history because she was adopted.  She was very active prior to [place] and then got [employer] vaccine and a booster after which she developed pretty predictable exertional substernal chest pain.  She had a coronary CTA performed 08/30/2021 that revealed a coronary calcium score of 0 with no evidence of plaque.  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  I am going to begin her empirically on amlodipine 2.5 mg.  Her blood pressure is somewhat soft at 114/82.  I will see her back in 3 months for follow-up.

## 2022-02-01 NOTE — Patient Instructions (Signed)
Medication Instructions:   -Start rosuvastatin (crestor) 5mg  three times a week.  -Start amlodipine (norvasc) 2.5mg  once daily.  *If you need a refill on your cardiac medications before your next appointment, please call your pharmacy*   Lab Work: Your physician recommends that you return for lab work in: 3 months for FASTING lipid/liver panel  If you have labs (blood work) drawn today and your tests are completely normal, you will receive your results only by: [place] (if you have MyChart) OR A paper copy in the mail If you have any lab test that is abnormal or we need to change your treatment, we will call you to review the results.   Follow-Up: At [facility], you and your health needs are our priority.  As part of our continuing mission to provide you with exceptional heart care, we have created designated Provider Care Teams.  These Care Teams include your primary Cardiologist (physician) and Advanced Practice Providers (APPs -  Physician Assistants and Nurse Practitioners) who all work together to provide you with the care you need, when you need it.  We recommend signing up for the patient portal called "MyChart".  Sign up information is provided on this After Visit Summary.  MyChart is used to connect with patients for Virtual Visits (Telemedicine).  Patients are able to view lab/test results, encounter notes, upcoming appointments, etc.  Non-urgent messages can be sent to your provider as well.   To learn more about what you can do with MyChart, go to [email or web address].    Your next appointment:   3 month(s)  The format for your next appointment:   In Person  Provider:   Quay Burow, MD

## 2022-02-01 NOTE — Progress Notes (Signed)
02/01/2022 Sabrina West   11/07/1960  [id number]  Primary Physician Sabrina Overlie, MD Primary Cardiologist: Sabrina Gess MD Sabrina West  HPI:  Sabrina West is a 61 y.o. 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  She was referred by her primary care provider, Dr. Jolly West, for evaluation of atypical chest pain.  She has seen Sabrina Felts,  West and Dr. Andris West for similar symptoms.  Her only cardiac risk factor is untreated hyperlipidemia.  She is otherwise healthy and was very active prior to COVID.  She did receive the Pfizer COVID-vaccine and a booster after which she developed predictable exertional chest pressure without radiation.  She had a coronary CTA performed 08/30/2021 that revealed a coronary calcium score of 0 with out evidence of plaque formation.  This was essentially normal.   Current Meds  Medication Sig   aspirin EC 81 MG tablet Take 1 tablet (81 mg total) by mouth 2 (two) times daily.   Fluoxetine HCl, PMDD, 10 MG TABS Take 0.5 tablets by mouth daily.   phenylephrine (SUDAFED PE) 10 MG TABS tablet Take 10 mg by mouth every 4 (four) hours as needed.   valACYclovir (VALTREX) 1000 MG tablet Take 1,000 mg by mouth 2 (two) times daily.   [DISCONTINUED] FLUoxetine (PROZAC) 10 MG tablet Take 10 mg by mouth daily.   [DISCONTINUED] ondansetron (ZOFRAN) 4 MG tablet Take 1 tablet (4 mg total) by mouth daily as needed for nausea or vomiting.   [DISCONTINUED] valACYclovir (VALTREX) 1000 MG tablet valacyclovir 1 gram tablet  Take 1 tablet twice a day by oral route as needed.     Allergies  Allergen Reactions   Codeine    Erythromycin Nausea Only   Penicillins Rash    Social History   Socioeconomic History   Marital status: Married    Spouse name: Not on file   Number of children: Not on file   Years of  education: Not on file   Highest education level: Not on file  Occupational History   Not on file  Tobacco Use   Smoking status: Never   Smokeless tobacco: Never  Substance and Sexual Activity   Alcohol use: Yes    Comment: social   Drug use: Never   Sexual activity: Not on file  Other Topics Concern   Not on file  Social History Narrative   Not on file   Social Determinants of Health   Financial Resource Strain: Not on file  Food Insecurity: Not on file  Transportation Needs: Not on file  Physical Activity: Not on file  Stress: Not on file  Social Connections: Not on file  Intimate Partner Violence: Not on file     Review of Systems: General: negative for chills, fever, night sweats or weight changes.  Cardiovascular: negative for chest pain, dyspnea on exertion, edema, orthopnea, palpitations, paroxysmal nocturnal dyspnea or shortness of breath Dermatological: negative for rash Respiratory: negative for cough or wheezing Urologic: negative for hematuria Abdominal: negative for nausea, vomiting, diarrhea, bright red blood per rectum, melena, or hematemesis Neurologic: negative for visual changes, syncope, or dizziness All other systems reviewed and are otherwise negative except as noted above.    Blood pressure 114/82, pulse 71, resp. rate (!) 98, weight 183 lb 12.8 oz (83.4 kg).  General appearance: alert and no distress Neck: no adenopathy, no carotid  bruit, no JVD, supple, symmetrical, trachea midline, and thyroid not enlarged, symmetric, no tenderness/mass/nodules Lungs: clear to auscultation bilaterally Heart: regular rate and rhythm, S1, S2 normal, no murmur, click, rub or gallop Extremities: extremities normal, atraumatic, no cyanosis or edema Pulses: 2+ and symmetric Skin: Skin color, texture, turgor normal. No rashes or lesions Neurologic: Grossly normal  EKG sinus rhythm at 71 with minimal voltage for LVH.  I personally reviewed this EKG.  ASSESSMENT AND  PLAN:   Atypical chest pain Ms. Sabrina West was referred to me by her PCP, Dr. Kennedy West at [place] because of atypical chest pain.  She had seen Sabrina Dess, West and Dr. Inocencio West.  She her only risk factor is untreated hyperlipidemia.  She is unaware of her family history because she was adopted.  She was very active prior to Sabrina West and then got Sabrina West vaccine and a booster after which she developed pretty predictable exertional substernal chest pain.  She had a coronary CTA performed 08/30/2021 that revealed a coronary calcium score of 0 with no evidence of plaque.  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  I am going to begin her empirically on amlodipine 2.5 mg.  Her blood pressure is somewhat soft at 114/82.  I will see her back in 3 months for follow-up.     Sabrina Harp MD FACP,FACC,FAHA, [facility] 02/01/2022 9:25 AM

## 2022-03-04 ENCOUNTER — Ambulatory Visit
Admission: RE | Admit: 2022-03-04 | Discharge: 2022-03-04 | Disposition: A | Discharge status: Home / Self Care | Payer: BC Managed Care – PPO | Source: Ambulatory Visit | Attending: Obstetrics and Gynecology | Admitting: Obstetrics and Gynecology

## 2022-03-04 ENCOUNTER — Other Ambulatory Visit: Payer: Self-pay

## 2022-03-04 DIAGNOSIS — N6320 Unspecified lump in the left breast, unspecified quadrant: Secondary | ICD-10-CM

## 2022-04-07 ENCOUNTER — Encounter: Payer: Self-pay | Admitting: Cardiovascular Disease

## 2022-05-10 ENCOUNTER — Ambulatory Visit: Payer: BC Managed Care – PPO | Admitting: Cardiovascular Disease

## 2022-09-21 IMAGING — CT CT HEART MORP W/ CTA COR W/ SCORE W/ CA W/CM &/OR W/O CM
4 of 7 series · 8 of 20 positions shown, 9 images · IV contrast (APPLIED)
Comparison: None
COMPARISON: None

Addendum:
EXAM:
OVER-READ INTERPRETATION  CT CHEST

The following report is an over-read performed by radiologist Dr.
over-read does not include interpretation of cardiac or coronary
anatomy or pathology. The coronary calcium score/coronary CTA
interpretation by the cardiologist is attached.
CLINICAL DATA: 60F with chest pain.
Cardiac/Coronary  CT
TECHNIQUE: The patient was scanned on a [employer] Force scanner.

[Series 6: ts syst sharp · axial · 0.39mm/px · z∈[-376,-332]mm · 2 of 331 slices shown]
[im 111/331  lung]
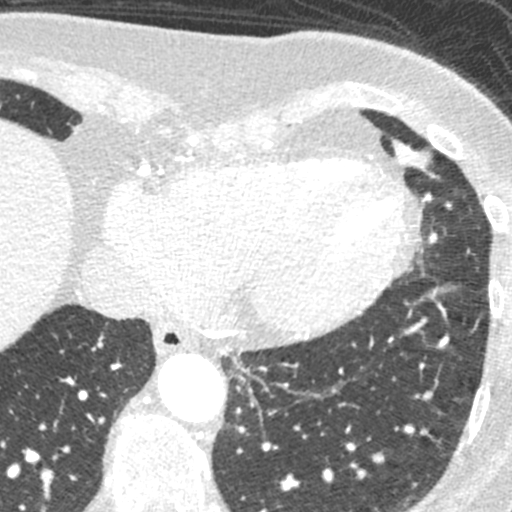
[im 221/331  lung]
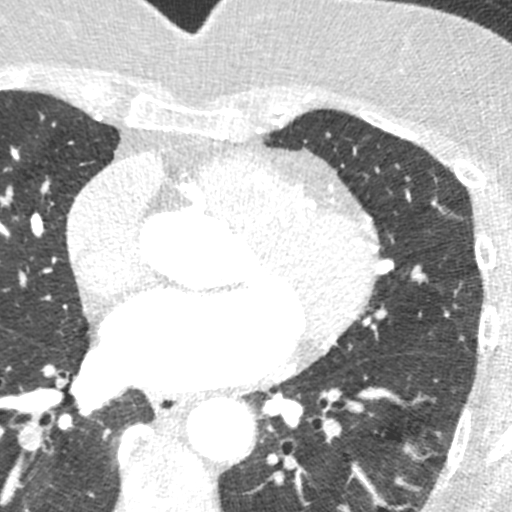

[Series 7: best syst · axial · 0.39mm/px · z∈[-376,-332]mm · 2 of 331 slices shown, 3 images]
[im 111/331  vessel]
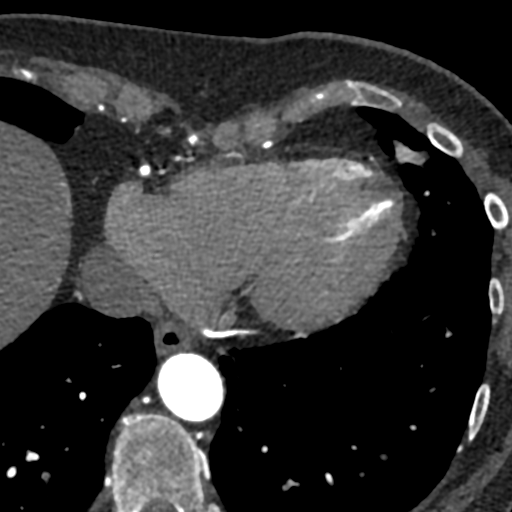
[im 111/331  lung]
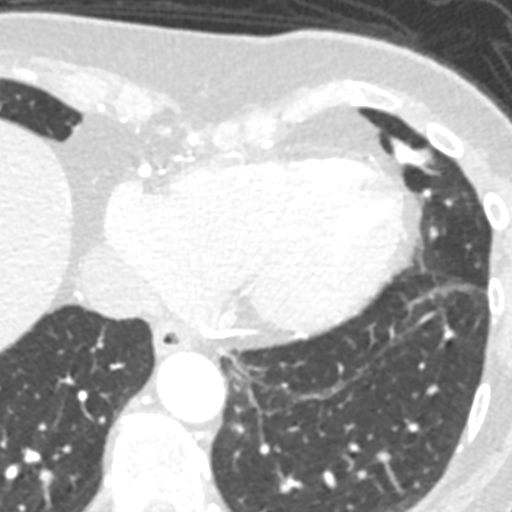
[im 221/331  vessel]
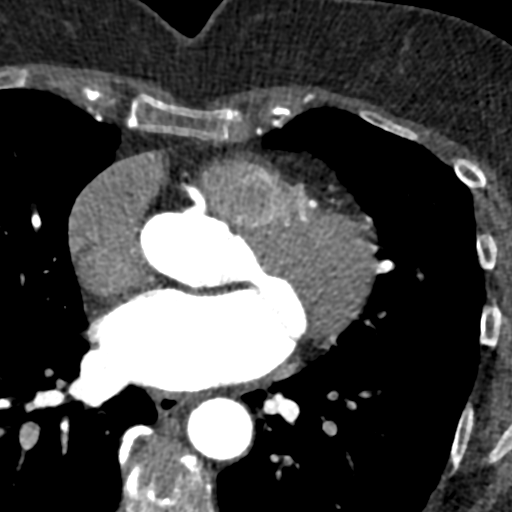

[Series 8: ts diast sharp · axial · 0.39mm/px · z∈[-377,-333]mm · 2 of 330 slices shown]
[im 110/330  lung]
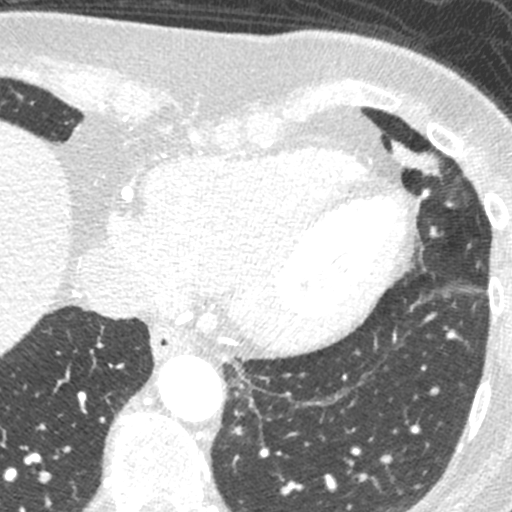
[im 220/330  lung]
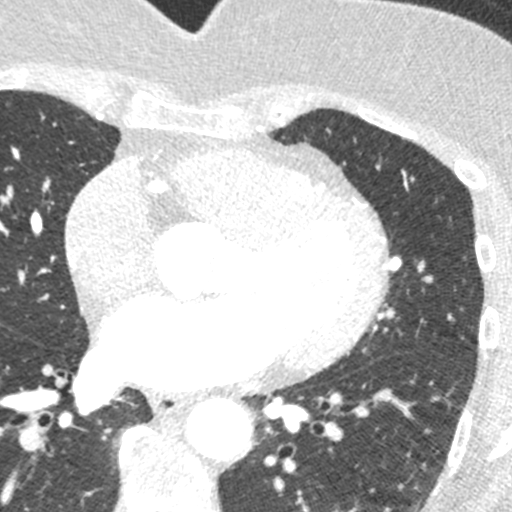

[Series 9: best diast · axial · 0.39mm/px · z∈[-376,-332]mm · 2 of 331 slices shown]
[im 111/331  vessel]
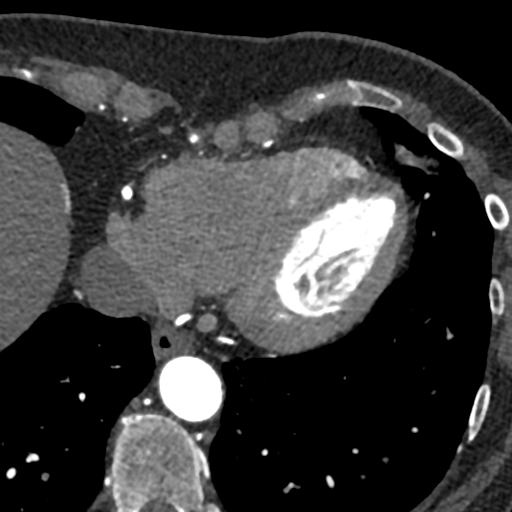
[im 221/331  vessel]
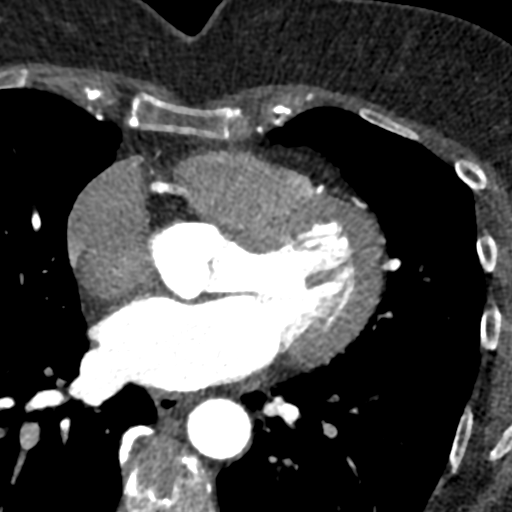

[8 of 20 positions shown; findings below may reference images not displayed]

FINDINGS: Vascular: No significant vascular findings. Normal heart size. No
pericardial effusion.

Mediastinum/Nodes: No enlarged lymph nodes. No mediastinal or hilar
mass identified.

Lungs/Pleura: Scarring identified within the lingula. No pleural
effusion or airspace consolidation. Calcified granuloma noted within
the periphery of the left lower lobe. Perifissural nodule within the
minor fissure is noted measuring 5 mm. This is compatible with a
benign intrapulmonary lymph node. No suspicious lung nodules.

Upper Abdomen: No acute findings within the imaged portions of the
upper abdomen. Tiny hiatal hernia identified

Musculoskeletal: No acute or suspicious osseous findings.
IMPRESSION: 1. No active cardiopulmonary abnormalities.
2. Tiny hiatal hernia.
3. Perifissural nodule within the minor fissure is noted measuring 5
mm. This is compatible with a benign intrapulmonary lymph node. No
follow-up recommended.
FINDINGS: A 120 kV prospective scan was triggered in the descending thoracic
aorta at 111 HU's. Axial non-contrast 3 mm slices were carried out
through the heart. The data set was analyzed on a dedicated work
station and scored using the Agatson method. Gantry rotation speed
was 250 msecs and collimation was .6 mm. No beta blockade and 0.8 mg
of sl NTG was given. The 3D data set was reconstructed in 5%
intervals of the 67-82 % of the R-R cycle. Diastolic phases were
analyzed on a dedicated work station using MPR, MIP and VRT modes.
The patient received 80 cc of contrast.

Aorta: Normal size. Ascending aorta 3.4 cm. No calcifications. No
dissection.

Aortic Valve:  Trileaflet.  No calcifications.

Coronary Arteries:  Normal coronary origin.  Right dominance.

RCA is a large dominant artery that gives rise to PDA and 3 SIU
branches. There is no plaque.

Left main is a large artery that gives rise to LAD and LCX arteries.

LAD is a large vessel that has no plaque.  D1 has no plaque.

LCX is a non-dominant artery that gives rise to one small OM1
branch. There is no plaque.

Coronary Calcium Score: 0

Other findings:

Normal pulmonary vein drainage into the left atrium.

Normal let atrial appendage without a thrombus.

Normal size of the pulmonary artery.
IMPRESSION: 1. Coronary calcium score of 0. This was 0 percentile for age-,
race-, and sex-matched controls.

2. Normal coronary origin with right dominance.

3. No evidence of CAD.  CAD-RADS 0.

4.  Consider non-cardiac causes of chest pain.

*** End of Addendum ***
EXAM:
OVER-READ INTERPRETATION  CT CHEST

The following report is an over-read performed by radiologist Dr.
over-read does not include interpretation of cardiac or coronary
anatomy or pathology. The coronary calcium score/coronary CTA
interpretation by the cardiologist is attached.
FINDINGS: Vascular: No significant vascular findings. Normal heart size. No
pericardial effusion.

Mediastinum/Nodes: No enlarged lymph nodes. No mediastinal or hilar
mass identified.

Lungs/Pleura: Scarring identified within the lingula. No pleural
effusion or airspace consolidation. Calcified granuloma noted within
the periphery of the left lower lobe. Perifissural nodule within the
minor fissure is noted measuring 5 mm. This is compatible with a
benign intrapulmonary lymph node. No suspicious lung nodules.

Upper Abdomen: No acute findings within the imaged portions of the
upper abdomen. Tiny hiatal hernia identified

Musculoskeletal: No acute or suspicious osseous findings.
IMPRESSION: 1. No active cardiopulmonary abnormalities.
2. Tiny hiatal hernia.
3. Perifissural nodule within the minor fissure is noted measuring 5
mm. This is compatible with a benign intrapulmonary lymph node. No
follow-up recommended.

## 2023-04-05 ENCOUNTER — Ambulatory Visit
Admission: RE | Admit: 2023-04-05 | Discharge: 2023-04-05 | Disposition: A | Discharge status: Home / Self Care | Payer: BC Managed Care – PPO | Source: Ambulatory Visit | Attending: Obstetrics and Gynecology | Admitting: Obstetrics and Gynecology

## 2023-04-05 ENCOUNTER — Other Ambulatory Visit: Payer: Self-pay | Admitting: Obstetrics and Gynecology

## 2023-04-05 DIAGNOSIS — Z1239 Encounter for other screening for malignant neoplasm of breast: Secondary | ICD-10-CM
